# Patient Record
Sex: Female | Born: 1975 | Race: Black or African American | Hispanic: No | Marital: Married | Smoking: Current every day smoker
Health system: Southern US, Community
[De-identification: ages and names within clinical notes are randomized; demographics above are authoritative.]

## PROBLEM LIST (undated history)

## (undated) DIAGNOSIS — L02429 Furuncle of limb, unspecified: Secondary | ICD-10-CM

## (undated) DIAGNOSIS — D649 Anemia, unspecified: Secondary | ICD-10-CM

---

## 2018-05-22 ENCOUNTER — Other Ambulatory Visit: Payer: Self-pay | Admitting: Family Medicine

## 2018-05-22 DIAGNOSIS — N6310 Unspecified lump in the right breast, unspecified quadrant: Secondary | ICD-10-CM

## 2018-05-27 ENCOUNTER — Other Ambulatory Visit: Payer: Self-pay | Admitting: Family Medicine

## 2018-05-27 ENCOUNTER — Ambulatory Visit
Admission: RE | Admit: 2018-05-27 | Discharge: 2018-05-27 | Disposition: A | Discharge status: Home / Self Care | Payer: BLUE CROSS/BLUE SHIELD | Source: Ambulatory Visit | Attending: Family Medicine | Admitting: Family Medicine

## 2018-05-27 DIAGNOSIS — R2232 Localized swelling, mass and lump, left upper limb: Secondary | ICD-10-CM

## 2018-05-27 DIAGNOSIS — N6310 Unspecified lump in the right breast, unspecified quadrant: Secondary | ICD-10-CM

## 2018-11-20 ENCOUNTER — Ambulatory Visit: Payer: Self-pay | Admitting: Surgery

## 2018-11-20 DIAGNOSIS — N6310 Unspecified lump in the right breast, unspecified quadrant: Secondary | ICD-10-CM

## 2018-11-20 NOTE — H&P (Signed)
Kelly Yates Documented: 11/20/2018 9:28 AM Location: [facility] Patient #: [id number] DOB: 08/22/1976 Married / Language: English / Race: Black or African American Female  History of Present Illness (Shaniya Tashiro A. Alaine Loughney MD; 11/20/2018 9:52 AM) Patient words: Patient sent at the request of Dr. Stan Maynard for a right breast mass. The patient underwent mammography in July 2019 showed a 6. centimeter mass right breast upper outer quadrant. 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 2 lymph nodes were biopsy which returned benign. She was scheduled to have this removed but did not follow through. She underwent a recent MRI at [place] which showed this now to be 8.2 cm in size. She states been there for approximately a year. There is no pain, drainage or discharge. There is no family history of breast issues. No history of previous irradiation.                            ADDENDUM REPORT: 05/28/2018 14:07 ADDENDUM: Pathology revealed BIPHASIC (STROMAL/EPITHELIAL LESION) of RIGHT breast, 9 o'clock. 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 This was found to be concordant by Dr. Serena Chacko, with excision recommended. Please note, overall measurements of this mass are approximately 6.7 x 5.6 x 4.4 cm, with associated pleomorphic calcifications. Pathology revealed THERE IS NO EVIDENCE OF MALIGNANCY IN 1 OF 1 LYMPH NODE of RIGHT axilla. This was found to be concordant by Dr. Serena Chacko. Pathology revealed THERE IS NO EVIDENCE OF MALIGNANCY IN 1 OF 1 LYMPH NODE of LEFT axilla. This was found to be concordant by Dr. Serena Chacko. Pathology results will be discussed with the patient by Sharon Bailey, Breast Navigator, at [facility], [place], [place] and make surgical referral per request. Pathology and images will be faxed to  Sharon Bailey. It is recommended for the patient to return for LEFT diagnostic mammography in 12 months for probably benign calcifications. Pathology results reported by Angie Akers, RN on 05/28/2018. Electronically Signed By: Serena Chacko M.D. On: 05/28/2018 14:07 Addended by Chacko, Serena M, MD on 05/28/2018 2:21 PM  Study Result CLINICAL DATA: 41-year-old female with a suspicious right breast mass and indeterminate bilateral lymph nodes. EXAM: ULTRASOUND GUIDED BILATERAL BREAST CORE NEEDLE BIOPSY COMPARISON: Previous exam(s). FINDINGS: I met with the patient and we discussed the procedure of ultrasound-guided biopsy, including benefits and alternatives. We discussed the high likelihood of a successful procedure. We discussed the risks of the procedure, including infection, bleeding, tissue injury, clip migration, and inadequate sampling. Informed written consent was given. The usual time-out protocol was performed immediately prior to the procedure. Lesion quadrant: Upper-outer quadrant Using sterile technique and 1% Lidocaine as local anesthetic, under direct ultrasound visualization, a 12 gauge spring-loaded device was used to perform biopsy of a right breast mass using a lateral approach. At the conclusion of the procedure a heart shaped tissue marker clip was deployed into the biopsy cavity. Using sterile technique and 1% Lidocaine as local anesthetic, under direct ultrasound visualization, a 14 gauge spring-loaded device was used to perform biopsy of a right axillary lymph node using a lateral approach. At the conclusion of the procedure a HydroMARK tissue marker clip was deployed into the biopsy cavity. Using sterile technique and 1% Lidocaine as local anesthetic, under direct ultrasound visualization, a 14 gauge spring-loaded device was used to perform biopsy of a left axillary lymph node using a inferior approach.   At the conclusion of the procedure a HydroMARK tissue  marker clip was deployed into the biopsy cavity. Follow up 2 view mammogram was performed and dictated separately. IMPRESSION: Ultrasound guided biopsy of a right breast mass and bilateral axillary lymph nodes. No apparent complications. Electronically Signed: By: Serena Chacko M.D. On: 05/27/2018 17:02   ADDENDUM REPORT: 05/28/2018 14:07 ADDENDUM: Pathology revealed BIPHASIC (STROMAL/EPITHELIAL LESION) of RIGHT breast, 9 o'clock. 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 This was found to be concordant by Dr. Serena Chacko, with excision recommended. Please note, overall measurements of this mass are approximately 6.7 x 5.6 x 4.4 cm, with associated pleomorphic calcifications. Pathology revealed THERE IS NO EVIDENCE OF MALIGNANCY IN 1 OF 1 LYMPH NODE of RIGHT axilla. This was found to be concordant by Dr. Serena Chacko. Pathology revealed THERE IS NO EVIDENCE OF MALIGNANCY IN 1 OF 1 LYMPH NODE of LEFT axilla. This was found to be concordant by Dr. Serena Chacko. Pathology results will be discussed with the patient by Sharon Bailey, Breast Navigator, at [facility], [place], [place] and make surgical referral per request. Pathology and images will be faxed to Sharon Bailey. It is recommended for the patient to return for LEFT diagnostic mammography in 12 months for probably benign calcifications. Pathology results reported by Angie Akers, RN on 05/28/2018. Electronically Signed By: Serena Chacko M.D. On: 05/28/2018 14:07 Addended by Chacko, Serena M, MD on 05/28/2018 2:21 PM  Study Result CLINICAL DATA: 41-year-old female with a suspicious right breast mass and indeterminate bilateral lymph nodes. EXAM: ULTRASOUND GUIDED BILATERAL BREAST CORE NEEDLE BIOPSY COMPARISON: Previous exam(s). FINDINGS: I met with the patient and we discussed the procedure of ultrasound-guided biopsy, including benefits and alternatives. We  discussed the high likelihood of a successful procedure. We discussed the risks of the procedure, including infection, bleeding, tissue injury, clip migration, and inadequate sampling. Informed written consent was given. The usual time-out protocol was performed immediately prior to the procedure. Lesion quadrant: Upper-outer quadrant Using sterile technique and 1% Lidocaine as local anesthetic, under direct ultrasound visualization, a 12 gauge spring-loaded device was used to perform biopsy of a right breast mass using a lateral approach. At the conclusion of the procedure a heart shaped tissue marker clip was deployed into the biopsy cavity. Using sterile technique and 1% Lidocaine as local anesthetic, under direct ultrasound visualization, a 14 gauge spring-loaded device was used to perform biopsy of a right axillary lymph node using a lateral approach. At the conclusion of the procedure a HydroMARK tissue marker clip was deployed into the biopsy cavity. Using sterile technique and 1% Lidocaine as local anesthetic, under direct ultrasound visualization, a 14 gauge spring-loaded device was used to perform biopsy of a left axillary lymph node using a inferior approach. At the conclusion of the procedure a HydroMARK tissue marker clip was deployed into the biopsy cavity. Follow up 2 view mammogram was performed and dictated separately. IMPRESSION: Ultrasound guided biopsy of a right breast mass and bilateral axillary lymph nodes. No apparent complications. Electronically Signed: By: Serena Chacko M.D. On: 05/27/2018 17:02              Diagnosis 1. Breast, right, needle core biopsy, 9 o'clock - BIPHASIC (STROMAL/EPITHELIAL LESION). - SEE COMMENT. 2. Lymph node, needle/core biopsy, right axilla - THERE IS NO EVIDENCE OF MALIGNANCY IN 1 OF 1 LYMPH NODE (0/1). 3. Lymph node, needle/core biopsy, left axilla - THERE IS NO EVIDENCE OF MALIGNANCY IN 1 OF 1 LYMPH NODE  (  0/1). Microscopic.  The patient is a 42 year old female.   Past Surgical History (Kelsey Phillips, CMA; 11/20/2018 9:28 AM) Breast Biopsy Right. Cesarean Section - 1  Diagnostic Studies History (Kelsey Phillips, CMA; 11/20/2018 9:28 AM) Colonoscopy never Mammogram within last year Pap Smear 1-5 years ago  Allergies (Kelsey Phillips, CMA; 11/20/2018 9:30 AM) No Known Drug Allergies [11/20/2018]: Allergies Reconciled  Medication History (Kelsey Phillips, CMA; 11/20/2018 9:30 AM) Medications Reconciled  Social History (Kelsey Phillips, CMA; 11/20/2018 9:28 AM) Alcohol use Occasional alcohol use. Caffeine use Carbonated beverages, Coffee, Tea. Illicit drug use Remotely quit drug use. Tobacco use Current some day smoker.  Family History (Kelsey Phillips, CMA; 11/20/2018 9:28 AM) Alcohol Abuse Father. Arthritis Mother. Bleeding disorder Sister. Depression Sister. Diabetes Mellitus Father. Hypertension Father.  Pregnancy / Birth History (Kelsey Phillips, CMA; 11/20/2018 9:28 AM) Age at menarche 14 years. Gravida 2 Length (months) of breastfeeding 12-24 Maternal age 21-25 Para 1 Regular periods  Other Problems (Kelsey Phillips, CMA; 11/20/2018 9:28 AM) Lump In Breast     Review of Systems (Jameyah Fennewald A. Briunna Leicht MD; 11/20/2018 9:52 AM) General Not Present- Appetite Loss, Chills, Fatigue, Fever, Night Sweats, Weight Gain and Weight Loss. Skin Not Present- Change in Wart/Mole, Dryness, Hives, Jaundice, New Lesions, Non-Healing Wounds, Rash and Ulcer. HEENT Not Present- Earache, Hearing Loss, Hoarseness, Nose Bleed, Oral Ulcers, Ringing in the Ears, Seasonal Allergies, Sinus Pain, Sore Throat, Visual Disturbances, Wears glasses/contact lenses and Yellow Eyes. Respiratory Not Present- Bloody sputum, Chronic Cough, Difficulty Breathing, Snoring and Wheezing. Breast Present- Breast Mass. Not Present- Breast Pain, Nipple Discharge and Skin  Changes. Cardiovascular Not Present- Chest Pain, Difficulty Breathing Lying Down, Leg Cramps, Palpitations, Rapid Heart Rate, Shortness of Breath and Swelling of Extremities. Gastrointestinal Not Present- Abdominal Pain, Bloating, Bloody Stool, Change in Bowel Habits, Chronic diarrhea, Constipation, Difficulty Swallowing, Excessive gas, Gets full quickly at meals, Hemorrhoids, Indigestion, Nausea, Rectal Pain and Vomiting. Female Genitourinary Not Present- Frequency, Nocturia, Painful Urination, Pelvic Pain and Urgency. Musculoskeletal Not Present- Back Pain, Joint Pain, Joint Stiffness, Muscle Pain, Muscle Weakness and Swelling of Extremities. Neurological Not Present- Decreased Memory, Fainting, Headaches, Numbness, Seizures, Tingling, Tremor, Trouble walking and Weakness. Psychiatric Not Present- Anxiety, Bipolar, Change in Sleep Pattern, Depression, Fearful and Frequent crying. Endocrine Not Present- Cold Intolerance, Excessive Hunger, Hair Changes, Heat Intolerance, Hot flashes and New Diabetes. Hematology Not Present- Blood Thinners, Easy Bruising, Excessive bleeding, Gland problems, HIV and Persistent Infections. All other systems negative  Vitals (Kelsey Phillips CMA; 11/20/2018 9:30 AM) 11/20/2018 9:29 AM Weight: 125.4 lb Height: 61in Body Surface Area: 1.55 m Body Mass Index: 23.69 kg/m  Temp.: 97.8F  Pulse: 114 (Regular)  BP: 140/80 (Sitting, Left Arm, Standard)      Physical Exam (Sherrilyn Nairn A. Keaja Reaume MD; 11/20/2018 9:52 AM)  General Mental Status-Alert. General Appearance-Consistent with stated age. Hydration-Well hydrated. Voice-Normal.  Head and Neck Head-normocephalic, atraumatic with no lesions or palpable masses. Trachea-midline. Thyroid Gland Characteristics - normal size and consistency.  Eye Eyeball - Bilateral-Extraocular movements intact. Sclera/Conjunctiva - Bilateral-No scleral icterus.  Chest and Lung Exam Chest and  lung exam reveals -quiet, even and easy respiratory effort with no use of accessory muscles and on auscultation, normal breath sounds, no adventitious sounds and normal vocal resonance. Inspection Chest Wall - Normal. Back - normal.  Breast Note: A centimeter mobile mass right breast upper outer quadrant technique of the entire quadrant. It is not fixed. Left breast is normal.  Cardiovascular Cardiovascular examination reveals -normal heart sounds, regular rate and   rhythm with no murmurs and normal pedal pulses bilaterally.  Neurologic Neurologic evaluation reveals -alert and oriented x 3 with no impairment of recent or remote memory. Mental Status-Normal.  Lymphatic Head & Neck  General Head & Neck Lymphatics: Bilateral - Description - Normal. Axillary  General Axillary Region: Bilateral - Description - Normal. Tenderness - Non Tender.    Assessment & Plan (Frady Taddeo A. Petrina Melby MD; 11/20/2018 9:54 AM)  FIBROADENOMA, RIGHT (D24.1) Impression: Large right breast mass. I discussed with her the potential cosmetic issues with lumpectomy. She will lose considerable volume in certain lumpectomy can be done but will leave her with a significant cosmetic defect. I discussed potential breast reconstruction with mastectomy. If this comes back as a phylloides tumor she may inevitably need this which she is aware of. She would like to start with lumpectomy understanding the potential cosmetic issues and potential long-term expectations especially if she requires further surgery and/or radiation treatments.  Risk of lumpectomy include bleeding, infection, seroma, more surgery, use of seed/wire, wound care, cosmetic deformity and the need for other treatments, death , blood clots, death. Pt agrees to proceed.  Current Plans You are being scheduled for surgery- Our schedulers will call you.  You should hear from our office's scheduling department within 5 working days about the  location, date, and time of surgery. We try to make accommodations for patient's preferences in scheduling surgery, but sometimes the OR schedule or the surgeon's schedule prevents us from making those accommodations.  If you have not heard from our office ([phone]) in 5 working days, call the office and ask for your surgeon's nurse.  If you have other questions about your diagnosis, plan, or surgery, call the office and ask for your surgeon's nurse.  Pt Education - [facility] Breast Biopsy HCI: discussed with patient and provided information. 

## 2018-12-10 ENCOUNTER — Other Ambulatory Visit: Payer: Self-pay | Admitting: Surgery

## 2018-12-10 DIAGNOSIS — N6310 Unspecified lump in the right breast, unspecified quadrant: Secondary | ICD-10-CM

## 2018-12-19 DIAGNOSIS — L02429 Furuncle of limb, unspecified: Secondary | ICD-10-CM

## 2018-12-19 HISTORY — DX: Furuncle of limb, unspecified: L02.429

## 2018-12-29 ENCOUNTER — Other Ambulatory Visit: Payer: Self-pay

## 2018-12-29 ENCOUNTER — Encounter (HOSPITAL_BASED_OUTPATIENT_CLINIC_OR_DEPARTMENT_OTHER): Payer: Self-pay | Admitting: *Deleted

## 2018-12-29 NOTE — Progress Notes (Signed)
Pt having seed placed 01/04/19. Pt to come for urine preg and blood work on 2/13 or 2/14. Will also pick up Ensure pre-surgery drink and CHG for shower.

## 2018-12-31 ENCOUNTER — Encounter (HOSPITAL_BASED_OUTPATIENT_CLINIC_OR_DEPARTMENT_OTHER)
Admission: RE | Admit: 2018-12-31 | Discharge: 2018-12-31 | Disposition: A | Discharge status: Home / Self Care | Payer: BLUE CROSS/BLUE SHIELD | Source: Ambulatory Visit | Attending: Surgery | Admitting: Surgery

## 2018-12-31 DIAGNOSIS — D649 Anemia, unspecified: Secondary | ICD-10-CM | POA: Diagnosis not present

## 2018-12-31 DIAGNOSIS — Z01812 Encounter for preprocedural laboratory examination: Secondary | ICD-10-CM | POA: Diagnosis present

## 2018-12-31 DIAGNOSIS — F17200 Nicotine dependence, unspecified, uncomplicated: Secondary | ICD-10-CM | POA: Diagnosis not present

## 2018-12-31 DIAGNOSIS — N6311 Unspecified lump in the right breast, upper outer quadrant: Secondary | ICD-10-CM | POA: Diagnosis present

## 2018-12-31 DIAGNOSIS — C50411 Malignant neoplasm of upper-outer quadrant of right female breast: Secondary | ICD-10-CM | POA: Diagnosis not present

## 2018-12-31 LAB — CBC WITH DIFFERENTIAL/PLATELET
Abs Immature Granulocytes: 0.02 10*3/uL (ref 0.00–0.07)
BASOS ABS: 0 10*3/uL (ref 0.0–0.1)
Basophils Relative: 1 %
Eosinophils Absolute: 0.1 10*3/uL (ref 0.0–0.5)
Eosinophils Relative: 2 %
HCT: 37.6 % (ref 36.0–46.0)
HEMOGLOBIN: 10.3 g/dL — AB (ref 12.0–15.0)
Immature Granulocytes: 0 %
Lymphocytes Relative: 32 %
Lymphs Abs: 1.8 10*3/uL (ref 0.7–4.0)
MCH: 18.1 pg — ABNORMAL LOW (ref 26.0–34.0)
MCHC: 27.4 g/dL — ABNORMAL LOW (ref 30.0–36.0)
MCV: 66.1 fL — ABNORMAL LOW (ref 80.0–100.0)
Monocytes Absolute: 0.5 10*3/uL (ref 0.1–1.0)
Monocytes Relative: 10 %
Neutro Abs: 3.1 10*3/uL (ref 1.7–7.7)
Neutrophils Relative %: 55 %
Platelets: 396 10*3/uL (ref 150–400)
RBC: 5.69 MIL/uL — ABNORMAL HIGH (ref 3.87–5.11)
RDW: 17.8 % — ABNORMAL HIGH (ref 11.5–15.5)
WBC: 5.6 10*3/uL (ref 4.0–10.5)
nRBC: 0 % (ref 0.0–0.2)

## 2018-12-31 LAB — COMPREHENSIVE METABOLIC PANEL
ALT: 17 U/L (ref 0–44)
AST: 22 U/L (ref 15–41)
Albumin: 4.6 g/dL (ref 3.5–5.0)
Alkaline Phosphatase: 53 U/L (ref 38–126)
Anion gap: 10 (ref 5–15)
BUN: 8 mg/dL (ref 6–20)
CALCIUM: 10 mg/dL (ref 8.9–10.3)
CO2: 24 mmol/L (ref 22–32)
Chloride: 102 mmol/L (ref 98–111)
Creatinine, Ser: 0.63 mg/dL (ref 0.44–1.00)
GFR calc Af Amer: >60 mL/min (ref >60)
GFR calc non Af Amer: >60 mL/min (ref >60)
Glucose, Bld: 77 mg/dL (ref 70–99)
Potassium: 4.4 mmol/L (ref 3.5–5.1)
Sodium: 136 mmol/L (ref 135–145)
Total Bilirubin: 0.3 mg/dL (ref 0.3–1.2)
Total Protein: 8.6 g/dL — ABNORMAL HIGH (ref 6.5–8.1)

## 2018-12-31 LAB — POCT PREGNANCY, URINE: Preg Test, Ur: NEGATIVE

## 2018-12-31 NOTE — Progress Notes (Signed)
Ensure Pre-Surgery drink given to patient with instructions to complete by 0645 DOS.  Surgical scrub also given to patient with instructions for use.  Pt verbalized understanding of instructions.

## 2019-01-04 ENCOUNTER — Ambulatory Visit
Admission: RE | Admit: 2019-01-04 | Discharge: 2019-01-04 | Disposition: A | Discharge status: Home / Self Care | Payer: BLUE CROSS/BLUE SHIELD | Source: Ambulatory Visit | Attending: Surgery | Admitting: Surgery

## 2019-01-04 DIAGNOSIS — N6310 Unspecified lump in the right breast, unspecified quadrant: Secondary | ICD-10-CM

## 2019-01-04 NOTE — Anesthesia Preprocedure Evaluation (Addendum)
Anesthesia Evaluation  Patient identified by MRN, date of birth, ID band Patient awake    Reviewed: Allergy & Precautions, NPO status , Patient's Chart, lab work & pertinent test results  History of Anesthesia Complications Negative for: history of anesthetic complications  Airway Mallampati: II  TM Distance: >3 FB Neck ROM: Full    Dental  (+) Teeth Intact, Dental Advisory Given   Pulmonary Current Smoker,    Pulmonary exam normal breath sounds clear to auscultation       Cardiovascular negative cardio ROS Normal cardiovascular exam Rhythm:Regular Rate:Normal     Neuro/Psych negative neurological ROS     GI/Hepatic negative GI ROS, Neg liver ROS,   Endo/Other  negative endocrine ROS  Renal/GU negative Renal ROS     Musculoskeletal negative musculoskeletal ROS (+)   Abdominal   Peds  Hematology  (+) anemia ,   Anesthesia Other Findings Day of surgery medications reviewed with the patient.  Reproductive/Obstetrics                            Anesthesia Physical Anesthesia Plan  ASA: II  Anesthesia Plan: General   Post-op Pain Management:    Induction: Intravenous  PONV Risk Score and Plan: 3 and Treatment may vary due to age or medical condition, Ondansetron, Dexamethasone and Midazolam  Airway Management Planned: LMA  Additional Equipment:   Intra-op Plan:   Post-operative Plan: Extubation in OR  Informed Consent: I have reviewed the patients History and Physical, chart, labs and discussed the procedure including the risks, benefits and alternatives for the proposed anesthesia with the patient or authorized representative who has indicated his/her understanding and acceptance.     Dental advisory given  Plan Discussed with: CRNA  Anesthesia Plan Comments:        Anesthesia Quick Evaluation

## 2019-01-05 ENCOUNTER — Encounter (HOSPITAL_BASED_OUTPATIENT_CLINIC_OR_DEPARTMENT_OTHER): Payer: Self-pay | Admitting: *Deleted

## 2019-01-05 ENCOUNTER — Encounter (HOSPITAL_BASED_OUTPATIENT_CLINIC_OR_DEPARTMENT_OTHER)
Admission: RE | Disposition: A | Discharge status: Home / Self Care | Payer: Self-pay | Source: Home / Self Care | Attending: Surgery

## 2019-01-05 ENCOUNTER — Ambulatory Visit
Admission: RE | Admit: 2019-01-05 | Discharge: 2019-01-05 | Disposition: A | Discharge status: Home / Self Care | Payer: BLUE CROSS/BLUE SHIELD | Source: Ambulatory Visit | Attending: Surgery | Admitting: Surgery

## 2019-01-05 ENCOUNTER — Ambulatory Visit (HOSPITAL_BASED_OUTPATIENT_CLINIC_OR_DEPARTMENT_OTHER): Payer: BLUE CROSS/BLUE SHIELD | Admitting: Anesthesiology

## 2019-01-05 ENCOUNTER — Ambulatory Visit (HOSPITAL_BASED_OUTPATIENT_CLINIC_OR_DEPARTMENT_OTHER)
Admission: RE | Admit: 2019-01-05 | Discharge: 2019-01-05 | Disposition: A | Discharge status: Home / Self Care | Payer: BLUE CROSS/BLUE SHIELD | Attending: Surgery | Admitting: Surgery

## 2019-01-05 ENCOUNTER — Other Ambulatory Visit: Payer: Self-pay

## 2019-01-05 DIAGNOSIS — C50411 Malignant neoplasm of upper-outer quadrant of right female breast: Secondary | ICD-10-CM | POA: Insufficient documentation

## 2019-01-05 DIAGNOSIS — D649 Anemia, unspecified: Secondary | ICD-10-CM | POA: Insufficient documentation

## 2019-01-05 DIAGNOSIS — F17200 Nicotine dependence, unspecified, uncomplicated: Secondary | ICD-10-CM | POA: Insufficient documentation

## 2019-01-05 DIAGNOSIS — N6310 Unspecified lump in the right breast, unspecified quadrant: Secondary | ICD-10-CM

## 2019-01-05 HISTORY — PX: BREAST LUMPECTOMY WITH RADIOACTIVE SEED LOCALIZATION: SHX6424

## 2019-01-05 HISTORY — DX: Anemia, unspecified: D64.9

## 2019-01-05 HISTORY — DX: Furuncle of limb, unspecified: L02.429

## 2019-01-05 SURGERY — BREAST LUMPECTOMY WITH RADIOACTIVE SEED LOCALIZATION
Anesthesia: General | Site: Breast | Laterality: Right

## 2019-01-05 MED ORDER — ONDANSETRON HCL 4 MG/2ML IJ SOLN
INTRAMUSCULAR | Status: AC
  Filled 2019-01-05: qty 2

## 2019-01-05 MED ORDER — CELECOXIB 200 MG PO CAPS
ORAL_CAPSULE | ORAL | Status: AC
  Filled 2019-01-05: qty 1

## 2019-01-05 MED ORDER — DEXAMETHASONE SODIUM PHOSPHATE 10 MG/ML IJ SOLN
INTRAMUSCULAR | Status: AC
  Filled 2019-01-05: qty 1

## 2019-01-05 MED ORDER — FENTANYL CITRATE (PF) 100 MCG/2ML IJ SOLN
25.0000 ug | INTRAMUSCULAR | Status: DC | PRN
  Administered 2019-01-05: 50 ug via INTRAVENOUS

## 2019-01-05 MED ORDER — CHLORHEXIDINE GLUCONATE CLOTH 2 % EX PADS: 6.0000 | MEDICATED_PAD | Freq: Once | CUTANEOUS | Status: DC

## 2019-01-05 MED ORDER — SCOPOLAMINE 1 MG/3DAYS TD PT72: 1.0000 | MEDICATED_PATCH | Freq: Once | TRANSDERMAL | Status: DC | PRN

## 2019-01-05 MED ORDER — MIDAZOLAM HCL 2 MG/2ML IJ SOLN
INTRAMUSCULAR | Status: AC
  Filled 2019-01-05: qty 2

## 2019-01-05 MED ORDER — LIDOCAINE 2% (20 MG/ML) 5 ML SYRINGE
INTRAMUSCULAR | Status: AC
  Filled 2019-01-05: qty 5

## 2019-01-05 MED ORDER — LACTATED RINGERS IV SOLN
INTRAVENOUS | Status: DC
  Administered 2019-01-05 (×2): via INTRAVENOUS

## 2019-01-05 MED ORDER — OXYCODONE HCL 5 MG PO TABS: 5.0000 mg | ORAL_TABLET | Freq: Once | ORAL | Status: DC | PRN

## 2019-01-05 MED ORDER — CEFAZOLIN SODIUM-DEXTROSE 2-4 GM/100ML-% IV SOLN
2.0000 g | INTRAVENOUS | Status: AC
  Administered 2019-01-05: 2 g via INTRAVENOUS

## 2019-01-05 MED ORDER — IBUPROFEN 800 MG PO TABS: 800.0000 mg | ORAL_TABLET | Freq: Three times a day (TID) | ORAL | 0 refills | Status: AC | PRN

## 2019-01-05 MED ORDER — BUPIVACAINE-EPINEPHRINE (PF) 0.25% -1:200000 IJ SOLN
INTRAMUSCULAR | Status: DC | PRN
  Administered 2019-01-05: 30 mL

## 2019-01-05 MED ORDER — FENTANYL CITRATE (PF) 100 MCG/2ML IJ SOLN
INTRAMUSCULAR | Status: AC
  Filled 2019-01-05: qty 2

## 2019-01-05 MED ORDER — ONDANSETRON HCL 4 MG/2ML IJ SOLN
INTRAMUSCULAR | Status: DC | PRN
  Administered 2019-01-05: 4 mg via INTRAVENOUS

## 2019-01-05 MED ORDER — ACETAMINOPHEN 500 MG PO TABS
1000.0000 mg | ORAL_TABLET | ORAL | Status: AC
  Administered 2019-01-05: 1000 mg via ORAL

## 2019-01-05 MED ORDER — DEXAMETHASONE SODIUM PHOSPHATE 10 MG/ML IJ SOLN
INTRAMUSCULAR | Status: DC | PRN
  Administered 2019-01-05: 4 mg via INTRAVENOUS

## 2019-01-05 MED ORDER — GABAPENTIN 300 MG PO CAPS
300.0000 mg | ORAL_CAPSULE | ORAL | Status: AC
  Administered 2019-01-05: 300 mg via ORAL

## 2019-01-05 MED ORDER — MIDAZOLAM HCL 2 MG/2ML IJ SOLN
1.0000 mg | INTRAMUSCULAR | Status: DC | PRN
  Administered 2019-01-05: 2 mg via INTRAVENOUS

## 2019-01-05 MED ORDER — OXYCODONE HCL 5 MG/5ML PO SOLN: 5.0000 mg | Freq: Once | ORAL | Status: DC | PRN

## 2019-01-05 MED ORDER — ACETAMINOPHEN 10 MG/ML IV SOLN: 1000.0000 mg | Freq: Once | INTRAVENOUS | Status: DC | PRN

## 2019-01-05 MED ORDER — FENTANYL CITRATE (PF) 100 MCG/2ML IJ SOLN
50.0000 ug | INTRAMUSCULAR | Status: DC | PRN
  Administered 2019-01-05 (×2): 50 ug via INTRAVENOUS

## 2019-01-05 MED ORDER — ACETAMINOPHEN 500 MG PO TABS
ORAL_TABLET | ORAL | Status: AC
  Filled 2019-01-05: qty 2

## 2019-01-05 MED ORDER — LIDOCAINE HCL (CARDIAC) PF 100 MG/5ML IV SOSY
PREFILLED_SYRINGE | INTRAVENOUS | Status: DC | PRN
  Administered 2019-01-05: 50 mg via INTRAVENOUS

## 2019-01-05 MED ORDER — CELECOXIB 200 MG PO CAPS
200.0000 mg | ORAL_CAPSULE | ORAL | Status: AC
  Administered 2019-01-05: 200 mg via ORAL

## 2019-01-05 MED ORDER — PROMETHAZINE HCL 25 MG/ML IJ SOLN: 6.2500 mg | INTRAMUSCULAR | Status: DC | PRN

## 2019-01-05 MED ORDER — PROPOFOL 10 MG/ML IV BOLUS
INTRAVENOUS | Status: DC | PRN
  Administered 2019-01-05: 100 mg via INTRAVENOUS

## 2019-01-05 MED ORDER — OXYCODONE HCL 5 MG PO TABS: 5.0000 mg | ORAL_TABLET | Freq: Four times a day (QID) | ORAL | 0 refills | Status: AC | PRN

## 2019-01-05 MED ORDER — GABAPENTIN 300 MG PO CAPS
ORAL_CAPSULE | ORAL | Status: AC
  Filled 2019-01-05: qty 1

## 2019-01-05 MED ORDER — CEFAZOLIN SODIUM-DEXTROSE 2-4 GM/100ML-% IV SOLN
INTRAVENOUS | Status: AC
  Filled 2019-01-05: qty 100

## 2019-01-05 SURGICAL SUPPLY — 55 items
APPLIER CLIP 9.375 MED OPEN (MISCELLANEOUS)
BINDER BREAST LRG (GAUZE/BANDAGES/DRESSINGS) IMPLANT
BINDER BREAST MEDIUM (GAUZE/BANDAGES/DRESSINGS) ×3 IMPLANT
BINDER BREAST XLRG (GAUZE/BANDAGES/DRESSINGS) IMPLANT
BINDER BREAST XXLRG (GAUZE/BANDAGES/DRESSINGS) IMPLANT
BLADE SURG 15 STRL LF DISP TIS (BLADE) ×1 IMPLANT
BLADE SURG 15 STRL SS (BLADE) ×2
CANISTER SUC SOCK COL 7IN (MISCELLANEOUS) IMPLANT
CANISTER SUCT 1200ML W/VALVE (MISCELLANEOUS) ×3 IMPLANT
CHLORAPREP W/TINT 26ML (MISCELLANEOUS) ×3 IMPLANT
CLIP APPLIE 9.375 MED OPEN (MISCELLANEOUS) IMPLANT
COVER BACK TABLE 60X90IN (DRAPES) ×3 IMPLANT
COVER MAYO STAND STRL (DRAPES) ×3 IMPLANT
COVER PROBE W GEL 5X96 (DRAPES) ×3 IMPLANT
COVER WAND RF STERILE (DRAPES) IMPLANT
DECANTER SPIKE VIAL GLASS SM (MISCELLANEOUS) IMPLANT
DERMABOND ADVANCED (GAUZE/BANDAGES/DRESSINGS) ×2
DERMABOND ADVANCED .7 DNX12 (GAUZE/BANDAGES/DRESSINGS) ×1 IMPLANT
DRAPE LAPAROSCOPIC ABDOMINAL (DRAPES) IMPLANT
DRAPE LAPAROTOMY 100X72 PEDS (DRAPES) ×3 IMPLANT
DRAPE UTILITY XL STRL (DRAPES) ×3 IMPLANT
ELECT COATED BLADE 2.86 ST (ELECTRODE) ×3 IMPLANT
ELECT REM PT RETURN 9FT ADLT (ELECTROSURGICAL) ×3
ELECTRODE REM PT RTRN 9FT ADLT (ELECTROSURGICAL) ×1 IMPLANT
GLOVE BIO SURGEON STRL SZ 6.5 (GLOVE) ×2 IMPLANT
GLOVE BIO SURGEON STRL SZ7 (GLOVE) ×3 IMPLANT
GLOVE BIO SURGEONS STRL SZ 6.5 (GLOVE) ×1
GLOVE BIOGEL PI IND STRL 7.5 (GLOVE) ×1 IMPLANT
GLOVE BIOGEL PI IND STRL 8 (GLOVE) ×1 IMPLANT
GLOVE BIOGEL PI INDICATOR 7.5 (GLOVE) ×2
GLOVE BIOGEL PI INDICATOR 8 (GLOVE) ×2
GLOVE ECLIPSE 8.0 STRL XLNG CF (GLOVE) ×6 IMPLANT
GLOVE SURG SS PI 7.0 STRL IVOR (GLOVE) ×3 IMPLANT
GOWN STRL REUS W/ TWL LRG LVL3 (GOWN DISPOSABLE) ×2 IMPLANT
GOWN STRL REUS W/ TWL XL LVL3 (GOWN DISPOSABLE) ×1 IMPLANT
GOWN STRL REUS W/TWL LRG LVL3 (GOWN DISPOSABLE) ×4
GOWN STRL REUS W/TWL XL LVL3 (GOWN DISPOSABLE) ×2
HEMOSTAT ARISTA ABSORB 3G PWDR (HEMOSTASIS) IMPLANT
HEMOSTAT SNOW SURGICEL 2X4 (HEMOSTASIS) IMPLANT
KIT MARKER MARGIN INK (KITS) ×3 IMPLANT
NEEDLE HYPO 25X1 1.5 SAFETY (NEEDLE) ×3 IMPLANT
NS IRRIG 1000ML POUR BTL (IV SOLUTION) ×3 IMPLANT
PACK BASIN DAY SURGERY FS (CUSTOM PROCEDURE TRAY) ×3 IMPLANT
PENCIL BUTTON HOLSTER BLD 10FT (ELECTRODE) ×3 IMPLANT
SLEEVE SCD COMPRESS KNEE MED (MISCELLANEOUS) ×3 IMPLANT
SPONGE LAP 4X18 RFD (DISPOSABLE) ×3 IMPLANT
SUT MNCRL AB 4-0 PS2 18 (SUTURE) ×3 IMPLANT
SUT SILK 2 0 SH (SUTURE) IMPLANT
SUT VICRYL 3-0 CR8 SH (SUTURE) ×3 IMPLANT
SYR CONTROL 10ML LL (SYRINGE) ×3 IMPLANT
TOWEL GREEN STERILE FF (TOWEL DISPOSABLE) ×3 IMPLANT
TRAY FAXITRON CT DISP (TRAY / TRAY PROCEDURE) ×3 IMPLANT
TUBE CONNECTING 20'X1/4 (TUBING) ×1
TUBE CONNECTING 20X1/4 (TUBING) ×2 IMPLANT
YANKAUER SUCT BULB TIP NO VENT (SUCTIONS) ×3 IMPLANT

## 2019-01-05 NOTE — Interval H&P Note (Signed)
History and Physical Interval Note:  01/05/2019 8:24 AM  Kelly Yates  has presented today for surgery, with the diagnosis of FIBROADENOMA  The various methods of treatment have been discussed with the patient and family. After consideration of risks, benefits and other options for treatment, the patient has consented to  Procedure(s): RIGHT BREAST LUMPECTOMY WITH RADIOACTIVE SEED LOCALIZATION (Right) as a surgical intervention .  The patient's history has been reviewed, patient examined, no change in status, stable for surgery.  I have reviewed the patient's chart and labs.  Questions were answered to the patient's satisfaction.     [place]

## 2019-01-05 NOTE — Anesthesia Postprocedure Evaluation (Signed)
Anesthesia Post Note  Patient: Faryal Marxen  Procedure(s) Performed: RIGHT BREAST LUMPECTOMY WITH RADIOACTIVE SEED LOCALIZATION (Right Breast)     Patient location during evaluation: PACU Anesthesia Type: General Level of consciousness: awake and alert Pain management: pain level controlled Vital Signs Assessment: post-procedure vital signs reviewed and stable Respiratory status: spontaneous breathing, nonlabored ventilation and respiratory function stable Cardiovascular status: blood pressure returned to baseline and stable Postop Assessment: no apparent nausea or vomiting Anesthetic complications: no    Last Vitals:  Vitals:   01/05/19 1000 01/05/19 1015  BP: 100/63 (!) 111/58  Pulse: 94 82  Resp: 19 19  Temp: 36.7 C   SpO2: 97% 97%    Last Pain:  Vitals:   01/05/19 1000  TempSrc:   PainSc: 0-No pain                 Brennan Bailey

## 2019-01-05 NOTE — Transfer of Care (Signed)
Immediate Anesthesia Transfer of Care Note  Patient: Kelly Yates  Procedure(s) Performed: RIGHT BREAST LUMPECTOMY WITH RADIOACTIVE SEED LOCALIZATION (Right Breast)  Patient Location: PACU  Anesthesia Type:General  Level of Consciousness: awake, alert  and oriented  Airway & Oxygen Therapy: Patient Spontanous Breathing and Patient connected to face mask oxygen  Post-op Assessment: Report given to RN and Post -op Vital signs reviewed and stable  Post vital signs: Reviewed and stable  Last Vitals:  Vitals Value Taken Time  BP    Temp    Pulse    Resp    SpO2      Last Pain:  Vitals:   01/05/19 0708  TempSrc: Oral  PainSc: 0-No pain      Patients Stated Pain Goal: 0 (68/08/81 1031)  Complications: No apparent anesthesia complications

## 2019-01-05 NOTE — Discharge Instructions (Signed)
NO TYLENOL BEFORE 1:30 PM TODAY!       [place] Office Phone Number [phone]  BREAST BIOPSY/ PARTIAL MASTECTOMY: POST OP INSTRUCTIONS  Always review your discharge instruction sheet given to you by the facility where your surgery was performed.  IF YOU HAVE DISABILITY OR FAMILY LEAVE FORMS, YOU MUST BRING THEM TO THE OFFICE FOR PROCESSING.  DO NOT GIVE THEM TO YOUR DOCTOR.  1. A prescription for pain medication may be given to you upon discharge.  Take your pain medication as prescribed, if needed.  If narcotic pain medicine is not needed, then you may take acetaminophen (Tylenol) or ibuprofen (Advil) as needed. 2. Take your usually prescribed medications unless otherwise directed 3. If you need a refill on your pain medication, please contact your pharmacy.  They will contact our office to request authorization.  Prescriptions will not be filled after 5pm or on week-ends. 4. You should eat very light the first 24 hours after surgery, such as soup, crackers, pudding, etc.  Resume your normal diet the day after surgery. 5. Most patients will experience some swelling and bruising in the breast.  Ice packs and a good support bra will help.  Swelling and bruising can take several days to resolve.  6. It is common to experience some constipation if taking pain medication after surgery.  Increasing fluid intake and taking a stool softener will usually help or prevent this problem from occurring.  A mild laxative (Milk of Magnesia or Miralax) should be taken according to package directions if there are no bowel movements after 48 hours. 7. Unless discharge instructions indicate otherwise, you may remove your bandages 24-48 hours after surgery, and you may shower at that time.  You may have steri-strips (small skin tapes) in place directly over the incision.  These strips should be left on the skin for 7-10 days.  If your surgeon used skin glue on the incision, you may  shower in 24 hours.  The glue will flake off over the next 2-3 weeks.  Any sutures or staples will be removed at the office during your follow-up visit. 8. ACTIVITIES:  You may resume regular daily activities (gradually increasing) beginning the next day.  Wearing a good support bra or sports bra minimizes pain and swelling.  You may have sexual intercourse when it is comfortable. a. You may drive when you no longer are taking prescription pain medication, you can comfortably wear a seatbelt, and you can safely maneuver your car and apply brakes. b. RETURN TO WORK:  ______________________________________________________________________________________ 9. You should see your doctor in the office for a follow-up appointment approximately two weeks after your surgery.  Your doctors nurse will typically make your follow-up appointment when she calls you with your pathology report.  Expect your pathology report 2-3 business days after your surgery.  You may call to check if you do not hear from [place] after three days. 10. OTHER INSTRUCTIONS: _______________________________________________________________________________________________ _____________________________________________________________________________________________________________________________________ _____________________________________________________________________________________________________________________________________ _____________________________________________________________________________________________________________________________________  WHEN TO CALL YOUR DOCTOR: 1. Fever over 101.0 2. Nausea and/or vomiting. 3. Extreme swelling or bruising. 4. Continued bleeding from incision. 5. Increased pain, redness, or drainage from the incision.  The clinic staff is available to answer your questions during regular business hours.  Please dont hesitate to call and ask to speak to one of the nurses for clinical concerns.   If you have a medical emergency, go to the nearest emergency room or call 911.  A surgeon from [facility] is  always on call at the hospital.  For further questions, please visit [email or web address]      Post Anesthesia Home Care Instructions  Activity: Get plenty of rest for the remainder of the day. A responsible individual must stay with you for 24 hours following the procedure.  For the next 24 hours, DO NOT: -Drive a car -Paediatric nurse -Drink alcoholic beverages -Take any medication unless instructed by your physician -Make any legal decisions or sign important papers.  Meals: Start with liquid foods such as gelatin or soup. Progress to regular foods as tolerated. Avoid greasy, spicy, heavy foods. If nausea and/or vomiting occur, drink only clear liquids until the nausea and/or vomiting subsides. Call your physician if vomiting continues.  Special Instructions/Symptoms: Your throat may feel dry or sore from the anesthesia or the breathing tube placed in your throat during surgery. If this causes discomfort, gargle with warm salt water. The discomfort should disappear within 24 hours.  If you had a scopolamine patch placed behind your ear for the management of post- operative nausea and/or vomiting:  1. The medication in the patch is effective for 72 hours, after which it should be removed.  Wrap patch in a tissue and discard in the trash. Wash hands thoroughly with soap and water. 2. You may remove the patch earlier than 72 hours if you experience unpleasant side effects which may include dry mouth, dizziness or visual disturbances. 3. Avoid touching the patch. Wash your hands with soap and water after contact with the patch.

## 2019-01-05 NOTE — H&P (Signed)
Clyde Lundborg Documented: 11/20/2018 9:28 AM Location: [facility] Patient #: [id number] DOB: February 02, 1976 Married / Language: English / Race: Black or African American Female  History of Present Illness Marcello Moores A. Diante Barley MD; 11/20/2018 9:52 AM) Patient words: Patient sent at the request of Dr. Franki Cabot for a right breast mass. The patient underwent mammography in July 2019 showed a 6. centimeter mass right breast upper outer quadrant. 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 2 lymph nodes were biopsy which returned benign. She was scheduled to have this removed but did not follow through. She underwent a recent MRI at [facility] which showed this now to be 8.2 cm in size. She states been there for approximately a year. There is no pain, drainage or discharge. There is no family history of breast issues. No history of previous irradiation.                            ADDENDUM REPORT: 05/28/2018 14:07 ADDENDUM: Pathology revealed BIPHASIC (STROMAL/EPITHELIAL LESION) of RIGHT breast, 9 o'clock. 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 This was found to be concordant by Dr. Kristopher Oppenheim, with excision recommended. Please note, overall measurements of this mass are approximately 6.7 x 5.6 x 4.4 cm, with associated pleomorphic calcifications. Pathology revealed THERE IS NO EVIDENCE OF MALIGNANCY IN 1 OF 1 LYMPH NODE of RIGHT axilla. This was found to be concordant by Dr. Kristopher Oppenheim. Pathology revealed THERE IS NO EVIDENCE OF MALIGNANCY IN 1 OF 1 LYMPH NODE of LEFT axilla. This was found to be concordant by Dr. Kristopher Oppenheim. Pathology results will be discussed with the patient by Deland Pretty, Breast Navigator, at [facility], [place], [place] and make surgical referral per request. Pathology and images will be faxed to  [facility]. It is recommended for the patient to return for LEFT diagnostic mammography in 12 months for probably benign calcifications. Pathology results reported by Roselind Messier, RN on 05/28/2018. Electronically Signed By: Kristopher Oppenheim M.D. On: 05/28/2018 14:07 Addended by Shayne Alken, MD on 05/28/2018 2:21 PM  Study Result CLINICAL DATA: 43 year old female with a suspicious right breast mass and indeterminate bilateral lymph nodes. EXAM: ULTRASOUND GUIDED BILATERAL BREAST CORE NEEDLE BIOPSY COMPARISON: Previous exam(s). FINDINGS: I met with the patient and we discussed the procedure of ultrasound-guided biopsy, including benefits and alternatives. We discussed the high likelihood of a successful procedure. We discussed the risks of the procedure, including infection, bleeding, tissue injury, clip migration, and inadequate sampling. Informed written consent was given. The usual time-out protocol was performed immediately prior to the procedure. Lesion quadrant: Upper-outer quadrant Using sterile technique and 1% Lidocaine as local anesthetic, under direct ultrasound visualization, a 12 gauge spring-loaded device was used to perform biopsy of a right breast mass using a lateral approach. At the conclusion of the procedure a heart shaped tissue marker clip was deployed into the biopsy cavity. Using sterile technique and 1% Lidocaine as local anesthetic, under direct ultrasound visualization, a 14 gauge spring-loaded device was used to perform biopsy of a right axillary lymph node using a lateral approach. At the conclusion of the procedure a HydroMARK tissue marker clip was deployed into the biopsy cavity. Using sterile technique and 1% Lidocaine as local anesthetic, under direct ultrasound visualization, a 14 gauge spring-loaded device was used to perform biopsy of a left axillary lymph node using a inferior approach.  At the conclusion of the procedure a HydroMARK tissue  marker clip was deployed into the biopsy cavity. Follow up 2 view mammogram was performed and dictated separately. IMPRESSION: Ultrasound guided biopsy of a right breast mass and bilateral axillary lymph nodes. No apparent complications. Electronically Signed: By: Kristopher Oppenheim M.D. On: 05/27/2018 17:02   ADDENDUM REPORT: 05/28/2018 14:07 ADDENDUM: Pathology revealed BIPHASIC (STROMAL/EPITHELIAL LESION) of RIGHT breast, 9 o'clock. 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 This was found to be concordant by Dr. Kristopher Oppenheim, with excision recommended. Please note, overall measurements of this mass are approximately 6.7 x 5.6 x 4.4 cm, with associated pleomorphic calcifications. Pathology revealed THERE IS NO EVIDENCE OF MALIGNANCY IN 1 OF 1 LYMPH NODE of RIGHT axilla. This was found to be concordant by Dr. Kristopher Oppenheim. Pathology revealed THERE IS NO EVIDENCE OF MALIGNANCY IN 1 OF 1 LYMPH NODE of LEFT axilla. This was found to be concordant by Dr. Kristopher Oppenheim. Pathology results will be discussed with the patient by Deland Pretty, Breast Navigator, at [facility], [place], [place] and make surgical referral per request. Pathology and images will be faxed to [facility]. It is recommended for the patient to return for LEFT diagnostic mammography in 12 months for probably benign calcifications. Pathology results reported by Roselind Messier, RN on 05/28/2018. Electronically Signed By: Kristopher Oppenheim M.D. On: 05/28/2018 14:07 Addended by Shayne Alken, MD on 05/28/2018 2:21 PM  Study Result CLINICAL DATA: 43 year old female with a suspicious right breast mass and indeterminate bilateral lymph nodes. EXAM: ULTRASOUND GUIDED BILATERAL BREAST CORE NEEDLE BIOPSY COMPARISON: Previous exam(s). FINDINGS: I met with the patient and we discussed the procedure of ultrasound-guided biopsy, including benefits and alternatives. We  discussed the high likelihood of a successful procedure. We discussed the risks of the procedure, including infection, bleeding, tissue injury, clip migration, and inadequate sampling. Informed written consent was given. The usual time-out protocol was performed immediately prior to the procedure. Lesion quadrant: Upper-outer quadrant Using sterile technique and 1% Lidocaine as local anesthetic, under direct ultrasound visualization, a 12 gauge spring-loaded device was used to perform biopsy of a right breast mass using a lateral approach. At the conclusion of the procedure a heart shaped tissue marker clip was deployed into the biopsy cavity. Using sterile technique and 1% Lidocaine as local anesthetic, under direct ultrasound visualization, a 14 gauge spring-loaded device was used to perform biopsy of a right axillary lymph node using a lateral approach. At the conclusion of the procedure a HydroMARK tissue marker clip was deployed into the biopsy cavity. Using sterile technique and 1% Lidocaine as local anesthetic, under direct ultrasound visualization, a 14 gauge spring-loaded device was used to perform biopsy of a left axillary lymph node using a inferior approach. At the conclusion of the procedure a HydroMARK tissue marker clip was deployed into the biopsy cavity. Follow up 2 view mammogram was performed and dictated separately. IMPRESSION: Ultrasound guided biopsy of a right breast mass and bilateral axillary lymph nodes. No apparent complications. Electronically Signed: By: Kristopher Oppenheim M.D. On: 05/27/2018 17:02              Diagnosis 1. Breast, right, needle core biopsy, 9 o'clock - BIPHASIC (STROMAL/EPITHELIAL LESION). - SEE COMMENT. 2. Lymph node, needle/core biopsy, right axilla - THERE IS NO EVIDENCE OF MALIGNANCY IN 1 OF 1 LYMPH NODE (0/1). 3. Lymph node, needle/core biopsy, left axilla - THERE IS NO EVIDENCE OF MALIGNANCY IN 1 OF 1 LYMPH NODE  (  0/1). Microscopic.  The patient is a 43 year old female.   Past Surgical History Emeline Gins, [place]; 11/20/2018 9:28 AM) Breast Biopsy Right. Cesarean Section - 1  Diagnostic Studies History Emeline Gins, [place]; 11/20/2018 9:28 AM) Colonoscopy never Mammogram within last year Pap Smear 1-5 years ago  Allergies Emeline Gins, [place]; 11/20/2018 9:30 AM) No Known Drug Allergies [11/20/2018]: Allergies Reconciled  Medication History Emeline Gins, [place]; 11/20/2018 9:30 AM) Medications Reconciled  Social History Emeline Gins, [place]; 11/20/2018 9:28 AM) Alcohol use Occasional alcohol use. Caffeine use Carbonated beverages, Coffee, Tea. Illicit drug use Remotely quit drug use. Tobacco use Current some day smoker.  Family History Emeline Gins, [place]; 11/20/2018 9:28 AM) Alcohol Abuse Father. Arthritis Mother. Bleeding disorder Sister. Depression Sister. Diabetes Mellitus Father. Hypertension Father.  Pregnancy / Birth History Emeline Gins, [place]; 11/20/2018 9:28 AM) Age at menarche 69 years. Gravida 2 Length (months) of breastfeeding 12-24 Maternal age 79-25 Para 1 Regular periods  Other Problems Emeline Gins, CMA; 11/20/2018 9:28 AM) Lump In Breast     Review of Systems (Marieann Zipp A. Sherron Mummert MD; 11/20/2018 9:52 AM) General Not Present- Appetite Loss, Chills, Fatigue, Fever, Night Sweats, Weight Gain and Weight Loss. Skin Not Present- Change in Wart/Mole, Dryness, Hives, Jaundice, New Lesions, Non-Healing Wounds, Rash and Ulcer. HEENT Not Present- Earache, Hearing Loss, Hoarseness, Nose Bleed, Oral Ulcers, Ringing in the Ears, Seasonal Allergies, Sinus Pain, Sore Throat, Visual Disturbances, Wears glasses/contact lenses and Yellow Eyes. Respiratory Not Present- Bloody sputum, Chronic Cough, Difficulty Breathing, Snoring and Wheezing. Breast Present- Breast Mass. Not Present- Breast Pain, Nipple Discharge and Skin  Changes. Cardiovascular Not Present- Chest Pain, Difficulty Breathing Lying Down, Leg Cramps, Palpitations, Rapid Heart Rate, Shortness of Breath and Swelling of Extremities. Gastrointestinal Not Present- Abdominal Pain, Bloating, Bloody Stool, Change in Bowel Habits, Chronic diarrhea, Constipation, Difficulty Swallowing, Excessive gas, Gets full quickly at meals, Hemorrhoids, Indigestion, Nausea, Rectal Pain and Vomiting. Female Genitourinary Not Present- Frequency, Nocturia, Painful Urination, Pelvic Pain and Urgency. Musculoskeletal Not Present- Back Pain, Joint Pain, Joint Stiffness, Muscle Pain, Muscle Weakness and Swelling of Extremities. Neurological Not Present- Decreased Memory, Fainting, Headaches, Numbness, Seizures, Tingling, Tremor, Trouble walking and Weakness. Psychiatric Not Present- Anxiety, Bipolar, Change in Sleep Pattern, Depression, Fearful and Frequent crying. Endocrine Not Present- Cold Intolerance, Excessive Hunger, Hair Changes, Heat Intolerance, Hot flashes and New Diabetes. Hematology Not Present- Blood Thinners, Easy Bruising, Excessive bleeding, Gland problems, HIV and Persistent Infections. All other systems negative  Vitals Emeline Gins CMA; 11/20/2018 9:30 AM) 11/20/2018 9:29 AM Weight: 125.4 lb Height: 61in Body Surface Area: 1.55 m Body Mass Index: 23.69 kg/m  Temp.: 97.27F  Pulse: 114 (Regular)  BP: 140/80 (Sitting, Left Arm, Standard)      Physical Exam (Alzada Brazee A. Tanielle Emigh MD; 11/20/2018 9:52 AM)  General Mental Status-Alert. General Appearance-Consistent with stated age. Hydration-Well hydrated. Voice-Normal.  Head and Neck Head-normocephalic, atraumatic with no lesions or palpable masses. Trachea-midline. Thyroid Gland Characteristics - normal size and consistency.  Eye Eyeball - Bilateral-Extraocular movements intact. Sclera/Conjunctiva - Bilateral-No scleral icterus.  Chest and Lung Exam Chest and  lung exam reveals -quiet, even and easy respiratory effort with no use of accessory muscles and on auscultation, normal breath sounds, no adventitious sounds and normal vocal resonance. Inspection Chest Wall - Normal. Back - normal.  Breast Note: A centimeter mobile mass right breast upper outer quadrant technique of the entire quadrant. It is not fixed. Left breast is normal.  Cardiovascular Cardiovascular examination reveals -normal heart sounds, regular rate and  rhythm with no murmurs and normal pedal pulses bilaterally.  Neurologic Neurologic evaluation reveals -alert and oriented x 3 with no impairment of recent or remote memory. Mental Status-Normal.  Lymphatic Head & Neck  General Head & Neck Lymphatics: Bilateral - Description - Normal. Axillary  General Axillary Region: Bilateral - Description - Normal. Tenderness - Non Tender.    Assessment & Plan (Ashliegh Parekh A. Imane Burrough MD; 11/20/2018 9:54 AM)  Rodman Comp, RIGHT (D24.1) Impression: Large right breast mass. I discussed with her the potential cosmetic issues with lumpectomy. She will lose considerable volume in certain lumpectomy can be done but will leave her with a significant cosmetic defect. I discussed potential breast reconstruction with mastectomy. If this comes back as a phylloides tumor she may inevitably need this which she is aware of. She would like to start with lumpectomy understanding the potential cosmetic issues and potential long-term expectations especially if she requires further surgery and/or radiation treatments.  Risk of lumpectomy include bleeding, infection, seroma, more surgery, use of seed/wire, wound care, cosmetic deformity and the need for other treatments, death , blood clots, death. Pt agrees to proceed.  Current Plans You are being scheduled for surgery- Our schedulers will call you.  You should hear from our office's scheduling department within 5 working days about the  location, date, and time of surgery. We try to make accommodations for patient's preferences in scheduling surgery, but sometimes the OR schedule or the surgeon's schedule prevents [place] from making those accommodations.  If you have not heard from our office [phone]) in 5 working days, call the office and ask for your surgeon's nurse.  If you have other questions about your diagnosis, plan, or surgery, call the office and ask for your surgeon's nurse.  Pt Education - [facility] Breast Biopsy HCI: discussed with patient and provided information.

## 2019-01-05 NOTE — Anesthesia Procedure Notes (Signed)
Procedure Name: LMA Insertion Date/Time: 01/05/2019 8:50 AM Performed by: Jonna Munro, CRNA Pre-anesthesia Checklist: Patient identified, Emergency Drugs available, Suction available, Patient being monitored and Timeout performed Patient Re-evaluated:Patient Re-evaluated prior to induction Oxygen Delivery Method: Circle system utilized Preoxygenation: Pre-oxygenation with 100% oxygen Induction Type: IV induction Ventilation: Mask ventilation without difficulty LMA: LMA inserted LMA Size: 4.0 Number of attempts: 1 Placement Confirmation: positive ETCO2 and breath sounds checked- equal and bilateral Tube secured with: Tape Dental Injury: Teeth and Oropharynx as per pre-operative assessment

## 2019-01-05 NOTE — Op Note (Signed)
Preoperative diagnosis: Right breast mass  Postoperative diagnosis: Same  Procedure: Right breast seed lumpectomy  Surgeon: Erroll Luna, MD  Anesthesia: LMA with local consisting of 0.25% Sensorcaine local with epinephrine  EBL: 50 cc  Specimen: Large right breast mass with seed and clip verified by Faxitron  Drains: None  IV fluids: Per anesthesia record  Indications for procedure: Patient is a 43 year old female with enlarging right breast mass.  This was worked up in the past core biopsy which showed probable fibroadenoma but concerning given his change in size risk for possible phylloides tumor.  It was a large mass and a consultation we discussed possible mastectomy with reconstruction given the large area since it was over 15 cm in maximal diameter initially.  She opted for lumpectomy initially since she was not convinced at this point time that she want to go through mastectomy.  I explained to her that this was taking up two thirds of her breast volume at least and there would be significant cosmetic issues afterwards.  After discussion she still opted for lumpectomy and understood that cosmetic issues may need to be dealt with postoperatively depending on the result of surgery.The procedure has been discussed with the patient. Alternatives to surgery have been discussed with the patient.  Risks of surgery include bleeding,  Infection,  Seroma formation, death,  and the need for further surgery.   The patient understands and wishes to proceed.    Description of procedure: Patient was met in the holding area.  The right breast was marked as the correct side and neoprobe used to verify seed location.  She was taken back to the operating room.  She was placed supine upon the operating table.  After induction of LMA anesthesia right breast was prepped and draped in a sterile fashion and timeout was done.  She received appropriate preoperative antibiotics.  Films were available in the  room and neoprobe was available.  Neoprobe was used to verify the seed in the mass.  The mass was extremely large measuring at least 10 to 15 cm maximal diameter.  Neoprobe was used and seed was localized.  I opted to use an inframammary incision at this point time for best cosmesis.  Inframammary incision was made after infiltration of the area local anesthetic.  Dissection was carried down and the well circumscribed mass was identified.  Dissection was carried all the way around the mass for a grossly negative margin.  It had a capsule that was well demarcated.  There is no gross disease left behind.  The specimen was then oriented with ink and Faxitron image revealed both the seed and clip to be in the specimen with multiple microcalcifications.  Hemostasis achieved with cautery.  The superior portion of her breast filled this defect quite nicely.  I opted not to place a drain since the tissue slid down into the cavity vacated by the mass quite nicely.  I closed this cavity down with 3-0 Vicryl.  Cosmesis was quite good at this point.  3-0 Vicryl was used to close the cavity in its entirety and 4-0 Monocryl was used to close the skin in a subcuticular fashion.  Dermabond was applied.  Breast binder placed.  Specimen sent to pathology for further evaluation.  All counts were correct.  The patient was awoke extubated taken to recovery in satisfactory condition.

## 2019-01-06 ENCOUNTER — Encounter (HOSPITAL_BASED_OUTPATIENT_CLINIC_OR_DEPARTMENT_OTHER): Payer: Self-pay | Admitting: Surgery

## 2019-08-31 ENCOUNTER — Other Ambulatory Visit: Payer: Self-pay | Admitting: Surgery

## 2019-08-31 DIAGNOSIS — Z1231 Encounter for screening mammogram for malignant neoplasm of breast: Secondary | ICD-10-CM

## 2019-10-19 ENCOUNTER — Ambulatory Visit
Admission: RE | Admit: 2019-10-19 | Discharge: 2019-10-19 | Disposition: A | Discharge status: Home / Self Care | Payer: BC Managed Care – PPO | Source: Ambulatory Visit | Attending: Surgery | Admitting: Surgery

## 2019-10-19 ENCOUNTER — Other Ambulatory Visit: Payer: Self-pay

## 2019-10-19 DIAGNOSIS — Z1231 Encounter for screening mammogram for malignant neoplasm of breast: Secondary | ICD-10-CM

## 2019-10-20 ENCOUNTER — Other Ambulatory Visit: Payer: Self-pay | Admitting: Surgery

## 2019-10-20 DIAGNOSIS — R928 Other abnormal and inconclusive findings on diagnostic imaging of breast: Secondary | ICD-10-CM

## 2021-09-25 IMAGING — MG DIGITAL SCREENING BILAT W/ TOMO W/ CAD
6 of 10 series · 6 of 30 positions shown · non-contrast
Comparison: Previous exams dating back to 05/06/2017.

CLINICAL DATA: Screening. The patient has a history of a
calcifications in the left breast which were deemed probably benign
on 05/20/2018. Patient has a history of surgical excision of a
low-grade phyllodes tumor in the right breast performed on
01/05/2019.

EXAM:
DIGITAL SCREENING BILATERAL MAMMOGRAM WITH TOMO AND CAD

[R MLO synth-2D]
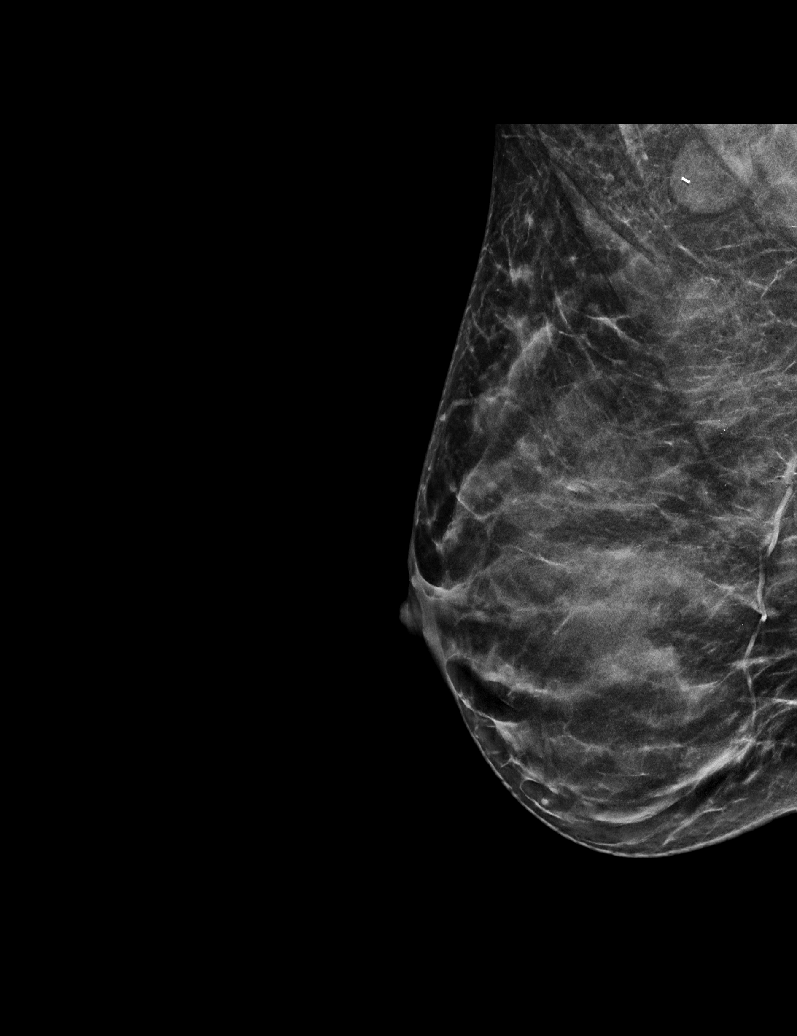

[L CC synth-2D]
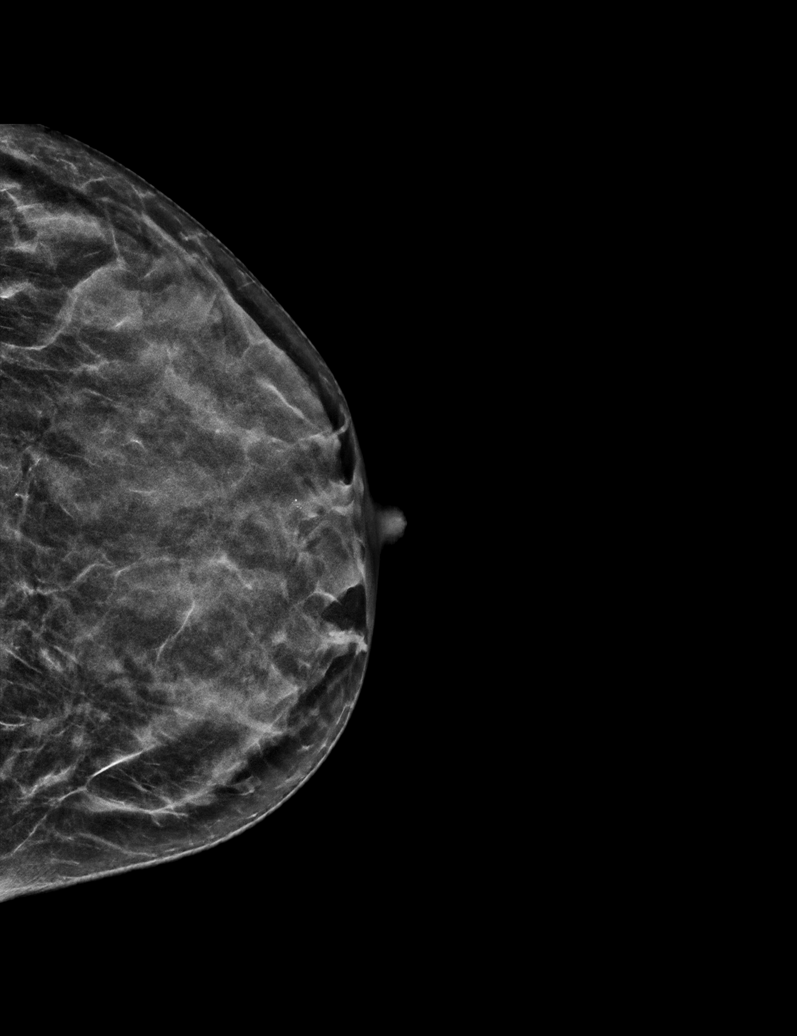

[R CC synth-2D (1 of 2)]
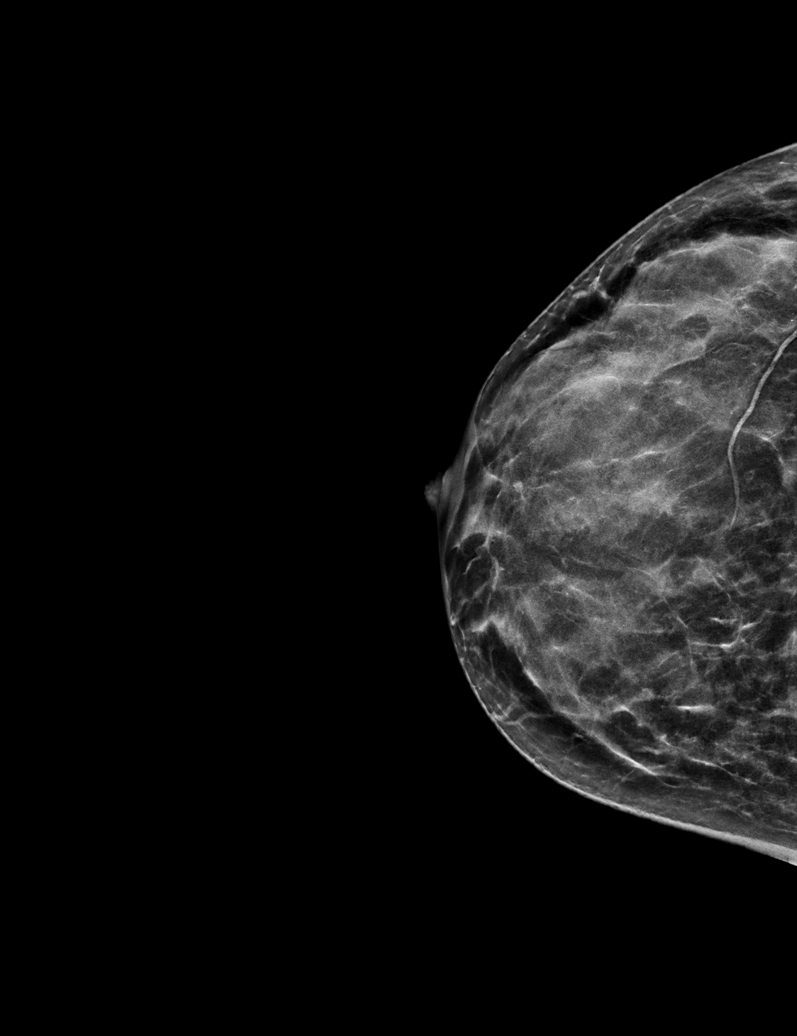

[L MLO synth-2D]
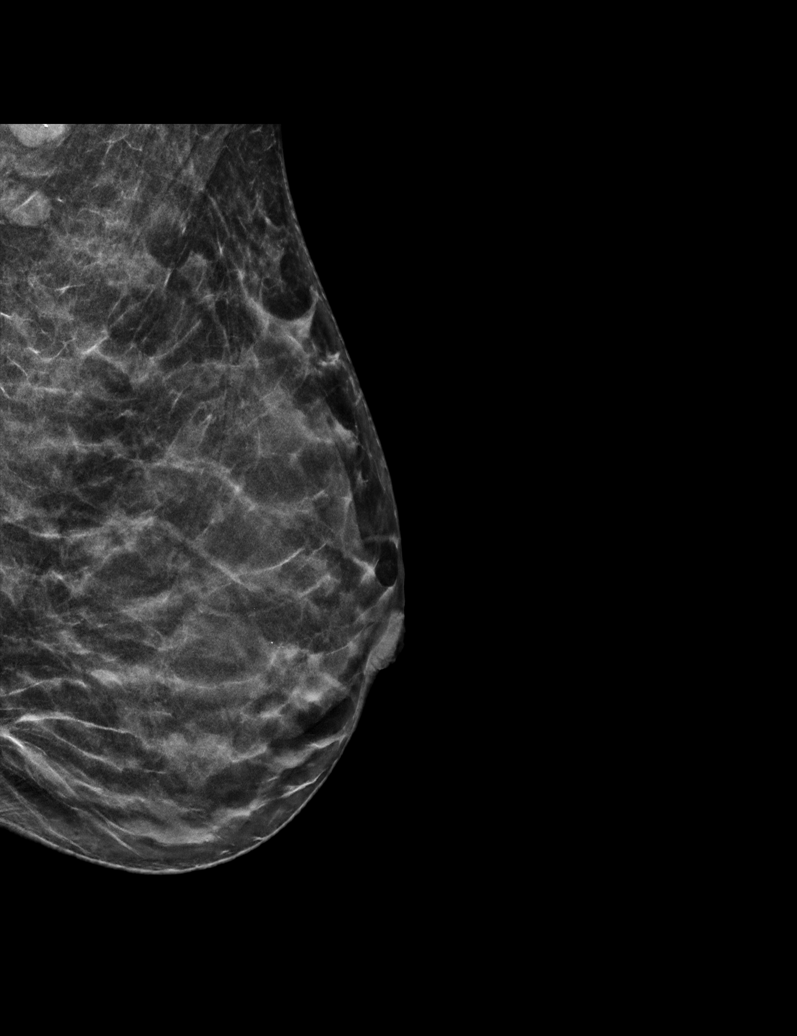

[R CC synth-2D (2 of 2)]
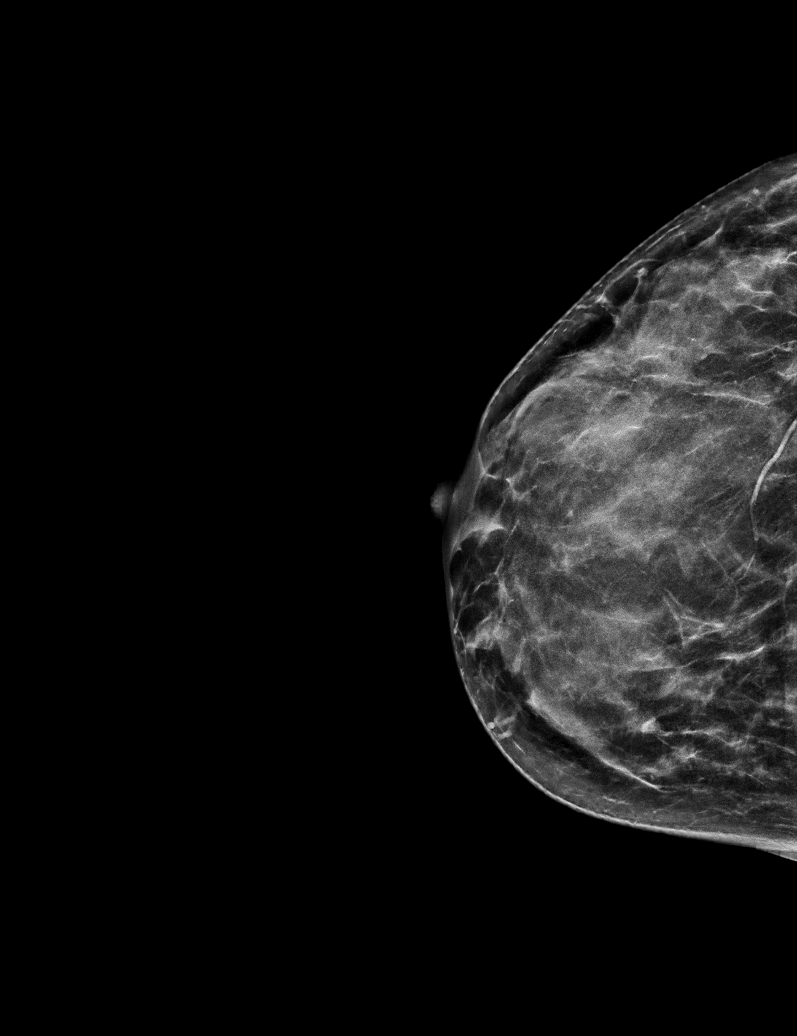

[R CC tomo · tomo slice 25/48.0]
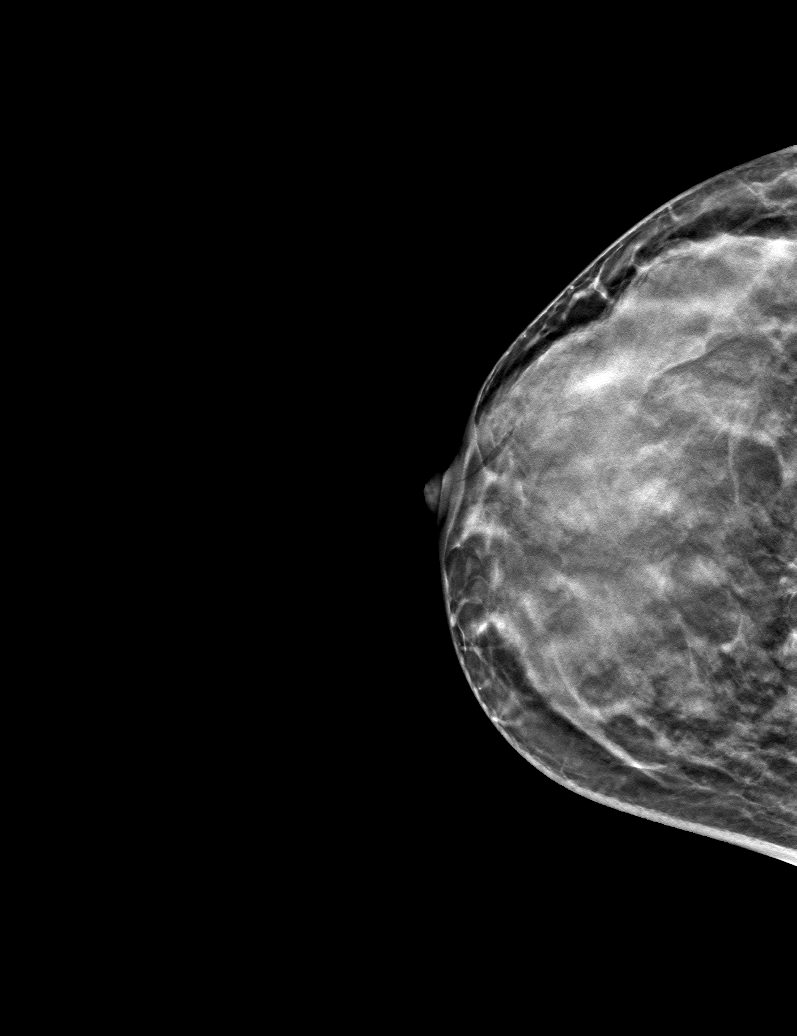

[6 of 30 positions shown; findings below may reference images not displayed]

ACR Breast Density Category c: The breast tissue is heterogeneously
dense, which may obscure small masses.
FINDINGS: In the left breast, grouped calcifications were previously described
as probably benign on 05/20/2018 and warrant further evaluation with
magnification views to determine stability since 05/06/2017. In the
right breast, no findings suspicious for malignancy. Images were
processed with CAD.
IMPRESSION: Further evaluation is suggested for calcifications in the left
breast. These were described as probably benign on 05/20/2018 and
warrant evaluation with magnification views to determine stability
since 05/06/2017.

RECOMMENDATION:
Diagnostic mammogram of the left breast. (Code:[id number])

The patient will be contacted regarding the findings, and additional
imaging will be scheduled.

BI-RADS CATEGORY  0: Incomplete. Need additional imaging evaluation
and/or prior mammograms for comparison.
